# Patient Record
Sex: Male | Born: 1960 | Race: Black or African American | Hispanic: No | Marital: Married | Smoking: Never smoker
Health system: Southern US, Community
[De-identification: ages and names within clinical notes are randomized; demographics above are authoritative.]

## PROBLEM LIST (undated history)

## (undated) ENCOUNTER — Emergency Department (HOSPITAL_COMMUNITY): Discharge status: Absent without leave | Payer: Self-pay

## (undated) DIAGNOSIS — J9819 Other pulmonary collapse: Secondary | ICD-10-CM

## (undated) DIAGNOSIS — A159 Respiratory tuberculosis unspecified: Secondary | ICD-10-CM

---

## 2018-09-10 ENCOUNTER — Other Ambulatory Visit: Payer: Self-pay

## 2018-09-10 ENCOUNTER — Encounter (HOSPITAL_COMMUNITY): Payer: Self-pay

## 2018-09-10 DIAGNOSIS — M545 Low back pain: Secondary | ICD-10-CM | POA: Insufficient documentation

## 2018-09-10 DIAGNOSIS — M79604 Pain in right leg: Secondary | ICD-10-CM | POA: Insufficient documentation

## 2018-09-10 NOTE — ED Triage Notes (Signed)
Pt arrived with complaints of right sided lower back pain after getting hit in the back with a box while at work. Pt ambulatory with some difficulty due to pain. No swelling or redness noted. Pt not complaining of any pain along the spine.

## 2018-09-11 ENCOUNTER — Emergency Department (HOSPITAL_COMMUNITY): Payer: Worker's Compensation

## 2018-09-11 ENCOUNTER — Emergency Department (HOSPITAL_COMMUNITY)
Admission: EM | Admit: 2018-09-11 | Discharge: 2018-09-11 | Disposition: A | Discharge status: Home / Self Care | Payer: Self-pay | Attending: Emergency Medicine | Admitting: Emergency Medicine

## 2018-09-11 DIAGNOSIS — M545 Low back pain, unspecified: Secondary | ICD-10-CM

## 2018-09-11 MED ORDER — KETOROLAC TROMETHAMINE 60 MG/2ML IM SOLN
30.0000 mg | Freq: Once | INTRAMUSCULAR | Status: AC
  Administered 2018-09-11: 30 mg via INTRAMUSCULAR
  Filled 2018-09-11: qty 2

## 2018-09-11 MED ORDER — METHOCARBAMOL 500 MG PO TABS: 500.0000 mg | ORAL_TABLET | Freq: Two times a day (BID) | ORAL | 0 refills | Status: AC | PRN

## 2018-09-11 MED ORDER — NAPROXEN 500 MG PO TABS: 500.0000 mg | ORAL_TABLET | Freq: Two times a day (BID) | ORAL | 0 refills | Status: DC | PRN

## 2018-09-11 MED ORDER — LIDOCAINE 5 % EX PTCH
1.0000 | MEDICATED_PATCH | CUTANEOUS | Status: DC
  Administered 2018-09-11: 1 via TRANSDERMAL
  Filled 2018-09-11: qty 1

## 2018-09-11 NOTE — ED Notes (Addendum)
Mis documentation on this patient.

## 2018-09-11 NOTE — ED Notes (Signed)
Patient ambulated to radiology.

## 2018-09-11 NOTE — Discharge Instructions (Signed)
Your Xray was reassuring and did not show any broken bone/fracture. Alternate ice and heat to areas of injury 3-4 times per day to limit inflammation and spasm.  Avoid strenuous activity and heavy lifting.  We recommend consistent use of naproxen in addition to Robaxin for muscle spasms.  Do not drive or drink alcohol after taking Robaxin as it may make you drowsy and impair your judgment.  We recommend follow-up with a primary care doctor to ensure resolution of symptoms.  Return to the ED for any new or concerning symptoms.

## 2018-09-11 NOTE — ED Provider Notes (Signed)
[place] DEPT Provider Note   CSN: [id number] Arrival date & time: 09/10/18  2205    History   Chief Complaint Chief Complaint  Patient presents with  . Back Pain    HPI Aaron Mendoza is a 58 y.o. male.     58 year old male with no significant past medical history presents to the emergency department for evaluation of right low back pain.  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  He has been having constant pain which will intermittently radiate down his right leg.  Pain is aggravated with movement as well as ambulation.  He has not taken any medications for his symptoms.  No history of prior back issues.  Denies associated numbness, extremity weakness, bowel or bladder incontinence, genital or perianal numbness.  The history is provided by the patient. No language interpreter was used.  Back Pain   History reviewed. No pertinent past medical history.  There are no active problems to display for this patient.   ** The histories are not reviewed yet. Please review them in the "History" navigator section and refresh this [place].      Home Medications    Prior to Admission medications   Medication Sig Start Date End Date Taking? Authorizing Provider  methocarbamol (ROBAXIN) 500 MG tablet Take 1 tablet (500 mg total) by mouth every 12 (twelve) hours as needed for muscle spasms. 09/11/18   Antonietta Breach, PA-C  naproxen (NAPROSYN) 500 MG tablet Take 1 tablet (500 mg total) by mouth every 12 (twelve) hours as needed for mild pain or moderate pain. 09/11/18   Antonietta Breach, PA-C    Family History No family history on file.  Social History Social History   Tobacco Use  . Smoking status: Not on file  Substance Use Topics  . Alcohol use: Not on file  . Drug use: Not on file     Allergies   Patient has no known allergies.   Review of  Systems Review of Systems  Musculoskeletal: Positive for back pain.  Ten systems reviewed and are negative for acute change, except as noted in the HPI.    Physical Exam Updated Vital Signs BP (!) 130/97 (BP Location: Left Arm)   Pulse 60   Temp 97.7 F (36.5 C) (Oral)   Resp 18   Ht 6\' 1"  (1.854 m)   SpO2 100%   Physical Exam Vitals signs and nursing note reviewed.  Constitutional:      General: He is not in acute distress.    Appearance: He is well-developed. He is not diaphoretic.     Comments: Nontoxic appearing and in NAD  HENT:     Head: Normocephalic and atraumatic.  Eyes:     General: No scleral icterus.    Conjunctiva/sclera: Conjunctivae normal.  Neck:     Musculoskeletal: Normal range of motion.  Cardiovascular:     Rate and Rhythm: Normal rate and regular rhythm.     Pulses: Normal pulses.  Pulmonary:     Effort: Pulmonary effort is normal. No respiratory distress.     Comments: Respirations even and unlabored Musculoskeletal: Normal range of motion.        General: Tenderness present.     Thoracic back: Normal.     Lumbar back: He exhibits tenderness and pain. He exhibits no edema and no deformity.  Back:     Comments: Positive straight leg raise on right; negative crossed straight leg raise. Mild spasm to right low back.  Skin:    General: Skin is warm and dry.     Coloration: Skin is not pale.     Findings: No erythema or rash.  Neurological:     General: No focal deficit present.     Mental Status: He is alert and oriented to person, place, and time.     Coordination: Coordination normal.     Comments: Ambulatory in the ED with antalgic gait. Sensation to light touch intact.  Psychiatric:        Behavior: Behavior normal.      ED Treatments / Results  Labs (all labs ordered are listed, but only abnormal results are displayed) Labs Reviewed - No data to display  EKG None  Radiology Dg Lumbar Spine Complete  Result Date: 09/11/2018  CLINICAL DATA:  Back pain EXAM: LUMBAR SPINE - COMPLETE 4+ VIEW COMPARISON:  None. FINDINGS: There is no evidence of lumbar spine fracture. Alignment is normal. Intervertebral disc spaces are maintained. IMPRESSION: Negative. Electronically Signed   By: Deatra RobinsonKevin  Herman M.D.   On: 09/11/2018 02:35    Procedures Procedures (including critical care time)  Medications Ordered in ED Medications  lidocaine (LIDODERM) 5 % 1 patch (1 patch Transdermal Patch Applied 09/11/18 0226)  ketorolac (TORADOL) injection 30 mg (30 mg Intramuscular Given 09/11/18 0228)     Initial Impression / Assessment and Plan / ED Course  I have reviewed the triage vital signs and the nursing notes.  Pertinent labs & imaging results that were available during my care of the patient were reviewed by me and considered in my medical decision making (see chart for details).        Patient with back pain onset after being struck in the back with a box at [employer] tonight.  He is neurovascularly intact.  Patient can walk but states is painful.  No loss of bowel or bladder control.  No concern for cauda equina.  Xray reassuring without evidence of fracture, dislocation, bony deformity.  Treated in the emergency department with IM Toradol and a Lidoderm patch as the patient drove himself to the ED.  Will continue with outpatient naproxen and Robaxin.  Return precautions discussed and provided. Patient discharged in stable condition with no unaddressed concerns.   Final Clinical Impressions(s) / ED Diagnoses   Final diagnoses:  Low back pain radiating to right lower extremity    ED Discharge Orders         Ordered    methocarbamol (ROBAXIN) 500 MG tablet  Every 12 hours PRN     09/11/18 0241    naproxen (NAPROSYN) 500 MG tablet  Every 12 hours PRN     09/11/18 0241           Antony MaduraHumes, Bernhardt Riemenschneider, PA-C 09/11/18 0259    Gilda CreasePollina, Christopher J, MD 09/11/18 [phone]

## 2018-09-12 ENCOUNTER — Telehealth: Payer: Self-pay | Admitting: *Deleted

## 2018-09-12 NOTE — Telephone Encounter (Signed)
Pt called for follow-up appointment after ED visit for a Workman's Comp case.  EDCM advised that pt should call employer HR department as Workman's Comp has certain stipulations as to who the pt may visit.

## 2018-10-09 ENCOUNTER — Ambulatory Visit: Payer: Self-pay | Admitting: Orthopaedic Surgery

## 2018-10-15 ENCOUNTER — Ambulatory Visit: Payer: Self-pay | Admitting: Orthopaedic Surgery

## 2019-03-01 ENCOUNTER — Encounter (HOSPITAL_COMMUNITY): Payer: Self-pay | Admitting: Emergency Medicine

## 2019-03-01 ENCOUNTER — Emergency Department (HOSPITAL_COMMUNITY)
Admission: EM | Admit: 2019-03-01 | Discharge: 2019-03-01 | Disposition: A | Discharge status: Home / Self Care | Attending: Emergency Medicine | Admitting: Emergency Medicine

## 2019-03-01 ENCOUNTER — Other Ambulatory Visit: Payer: Self-pay

## 2019-03-01 ENCOUNTER — Ambulatory Visit (HOSPITAL_COMMUNITY): Admission: RE | Admit: 2019-03-01 | Payer: Self-pay | Source: Ambulatory Visit

## 2019-03-01 DIAGNOSIS — F0781 Postconcussional syndrome: Secondary | ICD-10-CM | POA: Diagnosis not present

## 2019-03-01 DIAGNOSIS — S0990XA Unspecified injury of head, initial encounter: Secondary | ICD-10-CM | POA: Diagnosis present

## 2019-03-01 DIAGNOSIS — Y929 Unspecified place or not applicable: Secondary | ICD-10-CM | POA: Diagnosis not present

## 2019-03-01 DIAGNOSIS — S060X0A Concussion without loss of consciousness, initial encounter: Secondary | ICD-10-CM | POA: Insufficient documentation

## 2019-03-01 DIAGNOSIS — W228XXA Striking against or struck by other objects, initial encounter: Secondary | ICD-10-CM | POA: Insufficient documentation

## 2019-03-01 DIAGNOSIS — Y9389 Activity, other specified: Secondary | ICD-10-CM | POA: Diagnosis not present

## 2019-03-01 DIAGNOSIS — Y990 Civilian activity done for income or pay: Secondary | ICD-10-CM | POA: Diagnosis not present

## 2019-03-01 MED ORDER — IBUPROFEN 800 MG PO TABS
800.0000 mg | ORAL_TABLET | Freq: Once | ORAL | Status: AC
  Administered 2019-03-01: 800 mg via ORAL
  Filled 2019-03-01: qty 1

## 2019-03-01 MED ORDER — NAPROXEN 500 MG PO TABS: 500.0000 mg | ORAL_TABLET | Freq: Two times a day (BID) | ORAL | 0 refills | Status: AC

## 2019-03-01 NOTE — Discharge Instructions (Signed)
1. Medications: Naprosyn or Tylenol for pain 2. Treatment: Rest, ice on head.  Concussion precautions given - keep patient in a quiet, not simulating, dark environment. No TV, computer use, video games until headache is resolved completely. No contact sports until cleared by the pediatrician. 3. Follow Up: With primary care physician on Monday if headache persists; in 1 week if improving.  Return to the emergency department if patient becomes lethargic, begins vomiting, develops double vision, speech difficulty, problems walking or other change in mental status.

## 2019-03-01 NOTE — ED Provider Notes (Signed)
[facility]-EMERGENCY DEPT Provider Note   CSN: [id number] Arrival date & time: 03/01/19  0120     History Chief Complaint  Patient presents with  . Head Injury    Aaron Mendoza is a 58 y.o. male with a hx of no major medical problems presents to the Emergency Department complaining of acute, persistent headache after being hit in the head with a metal rod from the sprinkler around 9pm.  Patient reports he had no loss of consciousness and did not fall.  No numbness, tingling or weakness.  He denies vision changes but reports that he does feel somewhat dizzy and off-balance for short period of time when he stands.  He reports he is able to ambulate without difficulty. Patient has no history of concussion and is not anticoagulated.  No vomiting, confusion, seizures, numbness, weakness or tingling.  No neck pain or stiffness.  No treatments prior to arrival.  Patient reports that movement of his eyes does make his headache somewhat worse.  Nothing seems to make it better.   The history is provided by the patient and medical records. No language interpreter was used.       History reviewed. No pertinent past medical history.  There are no problems to display for this patient.   History reviewed. No pertinent surgical history.     History reviewed. No pertinent family history.  Social History   Tobacco Use  . Smoking status: Never Smoker  . Smokeless tobacco: Never Used  Substance Use Topics  . Alcohol use: Never  . Drug use: Never    Home Medications Prior to Admission medications   Medication Sig Start Date End Date Taking? Authorizing Provider  methocarbamol (ROBAXIN) 500 MG tablet Take 1 tablet (500 mg total) by mouth every 12 (twelve) hours as needed for muscle spasms. 09/11/18   Antony MaduraHumes, Kelly, PA-C  naproxen (NAPROSYN) 500 MG tablet Take 1 tablet (500 mg total) by mouth 2 (two) times daily with a meal. As needed for headaches 03/01/19   Tura Roller,  Dahlia ClientHannah, PA-C    Allergies    Patient has no known allergies.  Review of Systems   Review of Systems  Constitutional: Negative for appetite change, diaphoresis, fatigue, fever and unexpected weight change.  HENT: Negative for mouth sores.   Eyes: Negative for visual disturbance.  Respiratory: Negative for cough, chest tightness, shortness of breath and wheezing.   Cardiovascular: Negative for chest pain.  Gastrointestinal: Negative for abdominal pain, constipation, diarrhea, nausea and vomiting.  Endocrine: Negative for polydipsia, polyphagia and polyuria.  Genitourinary: Negative for dysuria, frequency, hematuria and urgency.  Musculoskeletal: Negative for back pain and neck stiffness.  Skin: Negative for rash.  Allergic/Immunologic: Negative for immunocompromised state.  Neurological: Positive for dizziness and headaches. Negative for syncope and light-headedness.  Hematological: Does not bruise/bleed easily.  Psychiatric/Behavioral: Negative for sleep disturbance. The patient is not nervous/anxious.     Physical Exam Updated Vital Signs BP 129/89 (BP Location: Right Arm)   Pulse 64   Temp 98.6 F (37 C) (Oral)   Resp 18   Ht 6\' 1"  (1.854 m)   Wt 74.8 kg   SpO2 99%   BMI 21.77 kg/m   Physical Exam Vitals and nursing note reviewed.  Constitutional:      General: He is not in acute distress.    Appearance: He is well-developed. He is not diaphoretic.  HENT:     Head: Normocephalic and atraumatic.     Comments: No contusion, ecchymosis or  laceration to the scalp. No Malocclusion No battle signs No [place] eyes No hemotympanum bilaterally Eyes:     General: No scleral icterus.    Conjunctiva/sclera: Conjunctivae normal.     Pupils: Pupils are equal, round, and reactive to light.     Comments: No horizontal, vertical or rotational nystagmus  Neck:     Comments: Full active and passive ROM without pain No midline or paraspinal tenderness No nuchal rigidity or  meningeal signs Cardiovascular:     Rate and Rhythm: Normal rate and regular rhythm.  Pulmonary:     Effort: Pulmonary effort is normal. No respiratory distress.  Abdominal:     General: Bowel sounds are normal.     Palpations: Abdomen is soft.     Tenderness: There is no abdominal tenderness. There is no guarding or rebound.  Musculoskeletal:        General: Normal range of motion.     Cervical back: Normal range of motion and neck supple.  Lymphadenopathy:     Cervical: No cervical adenopathy.  Skin:    General: Skin is warm and dry.     Findings: No rash.  Neurological:     Mental Status: He is alert and oriented to person, place, and time.     Cranial Nerves: No cranial nerve deficit.     Motor: No abnormal muscle tone.     Coordination: Coordination normal.     Comments: Mental Status:  Alert, oriented, thought content appropriate. Speech fluent without evidence of aphasia. Able to follow 2 step commands without difficulty.  Cranial Nerves:  II:  Peripheral visual fields grossly normal, pupils equal, round, reactive to light III,IV, VI: ptosis not present, extra-ocular motions intact bilaterally  V,VII: smile symmetric, facial light touch sensation equal VIII: hearing grossly normal bilaterally  IX,X: midline uvula rise  XI: bilateral shoulder shrug equal and strong XII: midline tongue extension  Motor:  5/5 in upper and lower extremities bilaterally including strong and equal grip strength and dorsiflexion/plantar flexion Sensory: light touch normal in all extremities.  Cerebellar: normal finger-to-nose with bilateral upper extremities Gait: normal gait and balance CV: distal pulses palpable throughout   Psychiatric:        Behavior: Behavior normal.        Thought Content: Thought content normal.        Judgment: Judgment normal.     ED Results / Procedures / Treatments    Procedures Procedures (including critical care time)  Medications Ordered in ED  Medications  ibuprofen (ADVIL) tablet 800 mg (has no administration in time range)    ED Course  I have reviewed the triage vital signs and the nursing notes.  Pertinent labs & imaging results that were available during my care of the patient were reviewed by me and considered in my medical decision making (see chart for details).    MDM Rules/Calculators/A&P                         Patient with head injury which did not cause of loss of consciousness but with persistent headache since the initial trauma.  No evidence of skull fracture on physical exam. Patient is not taking anticoagulants, is less than 65 and has no history of subarachnoid or subdural hemorrhage. Patient denies vomiting, amnesia, vision changes, cognitive or memory dysfunction.  Patient with no focal neurological deficits on physical exam.  Discussed thoroughly symptoms to return to the emergency department including severe headaches, disequilibrium, vomiting,  double vision, extremity weakness, difficulty ambulating, or any other concerning symptoms.  Discussed the likely etiology of patient's symptoms being concussive in nature.  Discussed the risk versus benefit of CT scan at this time I do not believe she warrants one. Patient agrees that CT is not indicated at this time.  Patient will be discharged with information pertaining to diagnosis and advised to use over-the-counter medications like NSAIDs and Tylenol for pain relief. Pt has also advised to not participate in contact sports until they are completely asymptomatic for at least 1 week or they are cleared by their doctor.     Final Clinical Impression(s) / ED Diagnoses Final diagnoses:  Concussion without loss of consciousness, initial encounter  Post concussive syndrome    Rx / DC Orders ED Discharge Orders         Ordered    naproxen (NAPROSYN) 500 MG tablet  2 times daily with meals     03/01/19 0427           Hiliary Osorto, Jarrett Soho, PA-C 03/01/19 0429     Orpah Greek, MD 03/01/19 [phone]

## 2019-03-01 NOTE — ED Triage Notes (Signed)
Patient is complaining of a dull headache that comes from a metal rod that fell out of the ceiling at work at [employer]. Patient has a small knot on left top of head.

## 2019-03-07 ENCOUNTER — Encounter (HOSPITAL_COMMUNITY): Payer: Self-pay | Admitting: Emergency Medicine

## 2019-03-07 ENCOUNTER — Emergency Department (HOSPITAL_COMMUNITY)

## 2019-03-07 ENCOUNTER — Emergency Department (HOSPITAL_COMMUNITY)
Admission: EM | Admit: 2019-03-07 | Discharge: 2019-03-07 | Disposition: A | Discharge status: Home / Self Care | Attending: Emergency Medicine | Admitting: Emergency Medicine

## 2019-03-07 ENCOUNTER — Other Ambulatory Visit: Payer: Self-pay

## 2019-03-07 DIAGNOSIS — R42 Dizziness and giddiness: Secondary | ICD-10-CM | POA: Diagnosis present

## 2019-03-07 DIAGNOSIS — F0781 Postconcussional syndrome: Secondary | ICD-10-CM

## 2019-03-07 LAB — CBC WITH DIFFERENTIAL/PLATELET
Abs Immature Granulocytes: 0.01 10*3/uL (ref 0.00–0.07)
Basophils Absolute: 0 10*3/uL (ref 0.0–0.1)
Basophils Relative: 0 %
Eosinophils Absolute: 0 10*3/uL (ref 0.0–0.5)
Eosinophils Relative: 1 %
HCT: 44.5 % (ref 39.0–52.0)
Hemoglobin: 13.7 g/dL (ref 13.0–17.0)
Immature Granulocytes: 0 %
Lymphocytes Relative: 22 %
Lymphs Abs: 1.4 10*3/uL (ref 0.7–4.0)
MCH: 26.3 pg (ref 26.0–34.0)
MCHC: 30.8 g/dL (ref 30.0–36.0)
MCV: 85.4 fL (ref 80.0–100.0)
Monocytes Absolute: 0.6 10*3/uL (ref 0.1–1.0)
Monocytes Relative: 9 %
Neutro Abs: 4.4 10*3/uL (ref 1.7–7.7)
Neutrophils Relative %: 68 %
Platelets: 197 10*3/uL (ref 150–400)
RBC: 5.21 MIL/uL (ref 4.22–5.81)
RDW: 14.2 % (ref 11.5–15.5)
WBC: 6.5 10*3/uL (ref 4.0–10.5)
nRBC: 0 % (ref 0.0–0.2)

## 2019-03-07 LAB — BASIC METABOLIC PANEL
Anion gap: 9 (ref 5–15)
BUN: 11 mg/dL (ref 6–20)
CO2: 28 mmol/L (ref 22–32)
Calcium: 9.1 mg/dL (ref 8.9–10.3)
Chloride: 101 mmol/L (ref 98–111)
Creatinine, Ser: 0.88 mg/dL (ref 0.61–1.24)
GFR calc Af Amer: >60 mL/min (ref >60)
GFR calc non Af Amer: >60 mL/min (ref >60)
Glucose, Bld: 96 mg/dL (ref 70–99)
Potassium: 4.1 mmol/L (ref 3.5–5.1)
Sodium: 138 mmol/L (ref 135–145)

## 2019-03-07 MED ORDER — ONDANSETRON HCL 4 MG PO TABS: 4.0000 mg | ORAL_TABLET | Freq: Four times a day (QID) | ORAL | 0 refills | Status: AC

## 2019-03-07 MED ORDER — MECLIZINE HCL 25 MG PO TABS: 25.0000 mg | ORAL_TABLET | Freq: Three times a day (TID) | ORAL | 0 refills | Status: AC | PRN

## 2019-03-07 NOTE — Discharge Instructions (Signed)
Your labwork and CT scan were reassuring today Please take medication as prescribed. Please allow brain rest as you are likely experiencing symptoms related to your concussion. While at home sit in a darkened room and avoid bright lights including lights from TV and cell phones.  Follow up with [facility] for further evaluation  Return to the ED for any worsening symptoms including worsening headache, difficulty walking, inability to keep liquids down, worsening vision changes, weakness or numbness on one side of your body, speech difficulties

## 2019-03-07 NOTE — ED Notes (Signed)
Pt ambulatory from triage 

## 2019-03-07 NOTE — ED Provider Notes (Signed)
Aaron Mendo[facility] Provider Note   CSN: [id number] Arrival date & time: 03/07/19  [id number]     History Chief Complaint  Patient presents with  . Dizziness    Aaron Mendoza is a 58 y.o. male with no significant PMHx who presents to the ED complaining of persistent dizziness and vision disturbances s/p head injury that occurred on 12/12.    Patient reports that while at work, on the 12th he was walking when a metal pole attached to the wall came undone from a brace swung and hit him on the left side of the head.  No loss of consciousness.  Patient states immediately afterwards he had double vision in his left eye with some nausea.  He was seen in the ED and ultimately did not receive a CT scan of his head after risk versus benefit was discussed with patient.  He reports that since then he has had intermittent dizziness as well as difficulty with vision. He reports that while at work he will see letters and numbers flip on themselves making it difficult to see. He also reports nausea and emesis; reports he is unable to keep anything down. He does also complain of a headache where he was hit. Pt reports it all became too much for him yesterday while at work and he had to call his family to come pick him up to leave work early. Pt denies fever, chills, abdominal pain, unilateral weakness or numbness, speech difficulties, facial droop.   The history is provided by the patient and medical records.       History reviewed. No pertinent past medical history.  There are no problems to display for this patient.   History reviewed. No pertinent surgical history.     No family history on file.  Social History   Tobacco Use  . Smoking status: Never Smoker  . Smokeless tobacco: Never Used  Substance Use Topics  . Alcohol use: Never  . Drug use: Never    Home Medications Prior to Admission medications   Medication Sig Start Date End Date Taking? Authorizing  Provider  naproxen (NAPROSYN) 500 MG tablet Take 1 tablet (500 mg total) by mouth 2 (two) times daily with a meal. As needed for headaches 03/01/19  Yes Muthersbaugh, Dahlia ClientHannah, PA-C  meclizine (ANTIVERT) 25 MG tablet Take 1 tablet (25 mg total) by mouth 3 (three) times daily as needed for dizziness. 03/07/19   Hyman HopesVenter, Dawon Troop, PA-C  methocarbamol (ROBAXIN) 500 MG tablet Take 1 tablet (500 mg total) by mouth every 12 (twelve) hours as needed for muscle spasms. Patient not taking: Reported on 03/07/2019 09/11/18   Antony MaduraHumes, Kelly, PA-C  ondansetron (ZOFRAN) 4 MG tablet Take 1 tablet (4 mg total) by mouth every 6 (six) hours. 03/07/19   Tanda RockersVenter, Baker Moronta, PA-C    Allergies    Patient has no known allergies.  Review of Systems   Review of Systems  Constitutional: Negative for chills and fever.  HENT: Negative for congestion.   Eyes: Positive for visual disturbance.  Respiratory: Negative for shortness of breath.   Cardiovascular: Negative for chest pain.  Gastrointestinal: Positive for nausea and vomiting. Negative for abdominal pain, constipation and diarrhea.  Genitourinary: Negative for difficulty urinating.  Musculoskeletal: Negative for myalgias.  Skin: Negative for rash.  Neurological: Positive for dizziness and headaches. Negative for speech difficulty, weakness and numbness.    Physical Exam Updated Vital Signs BP (!) 134/99   Pulse 65   Temp 97.7 F (36.5 C) (  Oral)   Resp 17   SpO2 100%   Physical Exam Vitals and nursing note reviewed.  Constitutional:      Appearance: He is not ill-appearing or diaphoretic.  HENT:     Head: Normocephalic and atraumatic.     Comments: No obvious hematoma appreciated to left parietal region. No stepoffs or deformity. + TTP.     Right Ear: Tympanic membrane normal.     Left Ear: Tympanic membrane normal.  Eyes:     Extraocular Movements: Extraocular movements intact.     Conjunctiva/sclera: Conjunctivae normal.     Pupils: Pupils are equal,  round, and reactive to light.  Cardiovascular:     Rate and Rhythm: Normal rate and regular rhythm.     Pulses: Normal pulses.  Pulmonary:     Effort: Pulmonary effort is normal.     Breath sounds: Normal breath sounds. No wheezing, rhonchi or rales.  Abdominal:     Palpations: Abdomen is soft.     Tenderness: There is no abdominal tenderness. There is no guarding or rebound.  Musculoskeletal:     Cervical back: Neck supple.  Skin:    General: Skin is warm and dry.  Neurological:     Mental Status: He is alert.     Comments: CN 3-12 grossly intact. Pt does have some difficulty with eye tracking when the lights are on and his glasses are off. When the lights are dimmed in the room and he wears his glasses he has no issue tracking. EOMi.  A&O x4 GCS 15 Sensation and strength intact. Strength 5/5 to BUE and BLEs.  Gait nonataxic including with tandem walking Pt has some difficulty with finger to nose coordination; he is slow to respond when my finger position changes but ultimately able to focus on my finger and follow along. Normal heal to shin.  Neg romberg, neg pronator drift       ED Results / Procedures / Treatments   Labs (all labs ordered are listed, but only abnormal results are displayed) Labs Reviewed  BASIC METABOLIC PANEL  CBC WITH DIFFERENTIAL/PLATELET    EKG EKG Interpretation  Date/Time:  Friday March 07 2019 10:39:05 EST Ventricular Rate:  58 PR Interval:    QRS Duration: 99 QT Interval:  422 QTC Calculation: 415 R Axis:   17 Text Interpretation: Sinus rhythm Borderline prolonged PR interval Baseline wander Artifact Abnormal ECG Confirmed by Gerhard Munch [phone]) on 03/07/2019 12:35:34 PM   Radiology CT Head Wo Contrast  Result Date: 03/07/2019 CLINICAL DATA:  Dizziness, headache following recent head trauma EXAM: CT HEAD WITHOUT CONTRAST TECHNIQUE: Contiguous axial images were obtained from the base of the skull through the vertex without  intravenous contrast. COMPARISON:  None. FINDINGS: Brain: No evidence of acute infarction, hemorrhage, hydrocephalus, extra-axial collection or mass lesion/mass effect. Cavum septum pellucidum and cavum vergae, an anatomic variant. Vascular: No hyperdense vessel or unexpected calcification. Skull: Normal. Negative for fracture or focal lesion. Sinuses/Orbits: No acute finding. Other: None. IMPRESSION: No acute intracranial abnormality. Electronically Signed   By: Duanne Guess M.D.   On: 03/07/2019 11:41    Procedures Procedures (including critical care time)  Medications Ordered in ED Medications - No data to display  ED Course  I have reviewed the triage vital signs and the nursing notes.  Pertinent labs & imaging results that were available during my care of the patient were reviewed by me and considered in my medical decision making (see chart for details).  68-year-old male who  presents to the ED today with continued complains of headache, dizziness, vision changes, nausea, vomiting after being hit in the head by a metal pole that fell off the wall while at work on 12/12.  Patient was evaluated that day in the ED and ultimately risk versus benefit was discussed with obtaining a CT head and ultimately patient did not receive a CT scan as he did not pass out and his neuro exam was reassuring at that time.  He was told that if his symptoms do not improve or he has any worsening symptoms he should return to the ED.  States that his symptoms have been getting too much for him to handle and he had to leave work early yesterday which prompted his ED visit today.   Was able to ambulate from the waiting room to his room without difficulty.  He does not appear unsteady with gait.  His vitals are stable today.  He is afebrile without tachycardia or tachypnea.  Patient able to follow commands without difficulty.  He does have some issues with tracking in a bright lit room.  Once the room is dimmed he has  no issues.  Suspect he is having difficulty with straining more than anything in regards to this.  He also has some questionable difficulty with finger-to-nose.  It seems as though patient is delayed with finding my finger once the position changes but ultimately is able to refocus on my finger and appropriately find it with his own finger.  Dysmetria.  Otherwise he has no focal neuro deficits on exam today.  He has equal strength bilaterally to upper and lower extremities.   Given he did not have a CT scan initially will obtain this at this time.  Question delayed bleed versus concussive symptoms versus TBI.  Obtain baseline screening labs and EKG as well.   Work reassuring.  CBC without any abnormalities.  No leukocytosis and hemoglobin stable.  BMP without any electrolyte abnormalities.  Potassium 4.1.  Creatinine stable 0.88.   ED had is negative.  Discussed case with attending physician Dr. Vanita Panda who recommends consulting neurology for further recommendations and to see whether or not patient truly needs an MRI done at this time.   Clinical Course as of Mar 06 1410  Fri Mar 07, 2019  1221 Discussed case with Dr. Lorraine Lax with neurology who does not believe pt requires an emergent MRI. Symptoms consistent with post concussive syndrome. Recommends symptomatic management.    [MV]    Clinical Course User Index [MV] Eustaquio Maize, PA-C   Will prescribe meclizine for dizziness.  Zofran for nausea.  Patient without QTC prolongation.  I have advised Tylenol as needed for headache.  Patient advised to follow-up with [facility] for further evaluation if no improvement.  Had lengthy discussion with patient regarding need for brain rest.  It appears he has been attempting to go back to work and not miss a beat which is unlikely given concussive symptoms.  I have written him out for a couple of days so that he can rest.  Strict return precautions have been discussed with patient.  He is in  agreement with plan at this time is stable for discharge home.   This note was prepared using Dragon voice recognition software and may include unintentional dictation errors due to the inherent limitations of voice recognition software.  MDM Rules/Calculators/A&P  Final Clinical Impression(s) / ED Diagnoses Final diagnoses:  Postconcussive syndrome    Rx / DC Orders ED Discharge Orders         Ordered    meclizine (ANTIVERT) 25 MG tablet  3 times daily PRN     03/07/19 1409    ondansetron (ZOFRAN) 4 MG tablet  Every 6 hours     03/07/19 1409           Tanda Rockers, PA-C 03/07/19 1412    Gerhard Munch, MD 03/07/19 1506

## 2019-03-07 NOTE — ED Notes (Signed)
Discharge paperwork and prescriptions reviewed with pt. Pt had no questions or concerns at this time.  Pt ambulatory at discharge.

## 2019-03-07 NOTE — ED Triage Notes (Signed)
Pt reports on 12/12 was hit in head by a metal pole at work and was seen here. Reports still having dizziness and headaches when he is up moving around and having nausea/vomiting. Reports the medications he was given aren't helping. Reports he didn't get a head CT when he was here before.

## 2019-05-19 ENCOUNTER — Other Ambulatory Visit: Payer: Self-pay | Admitting: Specialist

## 2019-05-19 DIAGNOSIS — F0781 Postconcussional syndrome: Secondary | ICD-10-CM

## 2019-05-19 DIAGNOSIS — R519 Headache, unspecified: Secondary | ICD-10-CM

## 2019-05-19 DIAGNOSIS — H9319 Tinnitus, unspecified ear: Secondary | ICD-10-CM

## 2019-06-18 ENCOUNTER — Ambulatory Visit
Admission: RE | Admit: 2019-06-18 | Discharge: 2019-06-18 | Disposition: A | Discharge status: Home / Self Care | Payer: Self-pay | Source: Ambulatory Visit | Attending: Specialist | Admitting: Specialist

## 2019-06-18 ENCOUNTER — Other Ambulatory Visit: Payer: Self-pay

## 2019-06-18 DIAGNOSIS — F0781 Postconcussional syndrome: Secondary | ICD-10-CM

## 2019-06-18 DIAGNOSIS — R519 Headache, unspecified: Secondary | ICD-10-CM

## 2019-06-18 DIAGNOSIS — H9319 Tinnitus, unspecified ear: Secondary | ICD-10-CM

## 2019-10-20 ENCOUNTER — Other Ambulatory Visit: Payer: Self-pay | Admitting: Specialist

## 2019-10-20 DIAGNOSIS — M5412 Radiculopathy, cervical region: Secondary | ICD-10-CM

## 2019-11-06 ENCOUNTER — Ambulatory Visit
Admission: RE | Admit: 2019-11-06 | Discharge: 2019-11-06 | Disposition: A | Discharge status: Home / Self Care | Payer: Worker's Compensation | Source: Ambulatory Visit | Attending: Specialist | Admitting: Specialist

## 2019-11-06 DIAGNOSIS — M5412 Radiculopathy, cervical region: Secondary | ICD-10-CM

## 2020-06-02 ENCOUNTER — Emergency Department (HOSPITAL_COMMUNITY)
Admission: EM | Admit: 2020-06-02 | Discharge: 2020-06-02 | Disposition: A | Discharge status: Home / Self Care | Payer: 59 | Attending: Emergency Medicine | Admitting: Emergency Medicine

## 2020-06-02 ENCOUNTER — Other Ambulatory Visit: Payer: Self-pay

## 2020-06-02 ENCOUNTER — Encounter (HOSPITAL_COMMUNITY): Payer: Self-pay

## 2020-06-02 DIAGNOSIS — B029 Zoster without complications: Secondary | ICD-10-CM

## 2020-06-02 DIAGNOSIS — L729 Follicular cyst of the skin and subcutaneous tissue, unspecified: Secondary | ICD-10-CM

## 2020-06-02 DIAGNOSIS — R21 Rash and other nonspecific skin eruption: Secondary | ICD-10-CM | POA: Diagnosis present

## 2020-06-02 HISTORY — DX: Other pulmonary collapse: J98.19

## 2020-06-02 HISTORY — DX: Respiratory tuberculosis unspecified: A15.9

## 2020-06-02 MED ORDER — PREDNISONE 20 MG PO TABS: 40.0000 mg | ORAL_TABLET | Freq: Every day | ORAL | 0 refills | Status: AC

## 2020-06-02 MED ORDER — HYDROCODONE-ACETAMINOPHEN 5-325 MG PO TABS: 1.0000 | ORAL_TABLET | Freq: Four times a day (QID) | ORAL | 0 refills | Status: AC | PRN

## 2020-06-02 MED ORDER — VALACYCLOVIR HCL 1 G PO TABS: 1000.0000 mg | ORAL_TABLET | Freq: Three times a day (TID) | ORAL | 0 refills | Status: AC

## 2020-06-02 NOTE — ED Triage Notes (Signed)
Pt reports that he has a small rash to his right side and an area to his left side that firm and it is draining. The rash started 5 days ago and the firm area started a month ago.

## 2020-06-02 NOTE — Discharge Instructions (Addendum)
As we discussed today, your rash is consistent with shingles.  We will plan to treat this with Valtrex, pain medication.  Please follow-up with the outpatient neurology office as directed.  Take pain medications as directed for break through pain. Do not drive or operate machinery while taking this medication.   Take prednisone as directed.   Additionally, the area on your left side appears to be a cyst.  As we discussed, this may require further excision by general surgery.  I have given you an referrals for them.  Return to the emergency department for any worsening pain, fevers, difficulty breathing or any other worsening concerning symptoms.

## 2020-06-02 NOTE — ED Provider Notes (Signed)
[facility] EMERGENCY DEPARTMENT Provider Note   CSN: [id number] Arrival date & time: 06/02/20  0551     History Chief Complaint  Patient presents with  . Rash    Aaron Mendoza is a 60 y.o. male who presents for evaluation of a rash that has been ongoing to his right side for last 4 days.  He states that he first noticed it at work and states that he had a burning type pain on his torso.  He states that he thought his pants were rubbing against his skin and so he went home and tried to take a shower.  He reports that this burning pain continued and started spreading.  He states that than a day later, he started noticing a rash.  He states he has had a red rash that has spread from his abdomen to his back.  He states that the area is painful.  He states the rash does not extend to his penis or testicles.  He denies any new medications, exposures.  No history of allergies.  He has not had any fever, chest pain, difficulty breathing.  He also reports that he has had an area on his left side that has been ongoing for several months.  He states that this area will routinely get bigger and then start draining and then seemed to get better and then returned.  He has not sought evaluation for this.  He denies any active drainage.  He denies any abdominal pain, rash on his hands or feet.  The history is provided by the patient.       Past Medical History:  Diagnosis Date  . Collapsed lung   . Tuberculosis     There are no problems to display for this patient.   History reviewed. No pertinent surgical history.     History reviewed. No pertinent family history.  Social History   Tobacco Use  . Smoking status: Never Smoker  . Smokeless tobacco: Never Used  Vaping Use  . Vaping Use: Never used  Substance Use Topics  . Alcohol use: Never  . Drug use: Never    Home Medications Prior to Admission medications   Medication Sig Start Date End Date Taking? Authorizing  Provider  meclizine (ANTIVERT) 25 MG tablet Take 1 tablet (25 mg total) by mouth 3 (three) times daily as needed for dizziness. 03/07/19   Hyman Hopes, Margaux, PA-C  methocarbamol (ROBAXIN) 500 MG tablet Take 1 tablet (500 mg total) by mouth every 12 (twelve) hours as needed for muscle spasms. Patient not taking: Reported on 03/07/2019 09/11/18   Antony Madura, PA-C  naproxen (NAPROSYN) 500 MG tablet Take 1 tablet (500 mg total) by mouth 2 (two) times daily with a meal. As needed for headaches 03/01/19   Muthersbaugh, Dahlia Client, PA-C  ondansetron (ZOFRAN) 4 MG tablet Take 1 tablet (4 mg total) by mouth every 6 (six) hours. 03/07/19   Tanda Rockers, PA-C    Allergies    Patient has no known allergies.  Review of Systems   Review of Systems  Constitutional: Negative for fever.  Respiratory: Negative for shortness of breath.   Cardiovascular: Negative for chest pain.  Gastrointestinal: Negative for abdominal pain, nausea and vomiting.  Skin: Positive for rash.  All other systems reviewed and are negative.   Physical Exam Updated Vital Signs BP 140/87 (BP Location: Right Arm)   Pulse 72   Temp 98.7 F (37.1 C) (Oral)   Resp 18   Ht 6\' 1"  (  1.854 m)   Wt 77.1 kg   SpO2 95%   BMI 22.43 kg/m   Physical Exam Vitals and nursing note reviewed.  Constitutional:      Appearance: Normal appearance. He is well-developed.  HENT:     Head: Normocephalic and atraumatic.     Mouth/Throat:     Comments: No oral lesions Eyes:     General: Lids are normal.     Conjunctiva/sclera: Conjunctivae normal.     Pupils: Pupils are equal, round, and reactive to light.  Cardiovascular:     Rate and Rhythm: Normal rate and regular rhythm.     Pulses: Normal pulses.     Heart sounds: Normal heart sounds. No murmur heard. No friction rub. No gallop.   Pulmonary:     Effort: Pulmonary effort is normal.     Breath sounds: Normal breath sounds.     Comments: Lungs clear to auscultation bilaterally.   Symmetric chest rise.  No wheezing, rales, rhonchi. Abdominal:     Palpations: Abdomen is soft. Abdomen is not rigid.     Tenderness: There is no abdominal tenderness. There is no guarding.     Comments: Abdomen is soft, non-distended, non-tender. No rigidity, No guarding. No peritoneal signs.  Musculoskeletal:        General: Normal range of motion.     Cervical back: Full passive range of motion without pain.  Skin:    General: Skin is warm and dry.     Capillary Refill: Capillary refill takes less than 2 seconds.          Comments: Scattered erythematous vesicular lesions noted to the T12 dermatome on the right side.  This does not extend over the midline.  No active drainage.  No rash noted on palms or soles of feet.  He has a small 2 cm in diameter subcutaneous cyst noted to the left torso.  No fluctuance.  No active drainage.  No surrounding warmth, erythema, edema.  Neurological:     Mental Status: He is alert and oriented to person, place, and time.  Psychiatric:        Speech: Speech normal.     ED Results / Procedures / Treatments   Labs (all labs ordered are listed, but only abnormal results are displayed) Labs Reviewed - No data to display  EKG None  Radiology No results found.  Procedures Procedures   Medications Ordered in ED Medications - No data to display  ED Course  I have reviewed the triage vital signs and the nursing notes.  Pertinent labs & imaging results that were available during my care of the patient were reviewed by me and considered in my medical decision making (see chart for details).    MDM Rules/Calculators/A&P                          60 year old male who presents for evaluation of rash that has been ongoing for the last 4 days.  He states he is for started noticing a burning pain to his side and states that a rash appeared.  No new medicines, exposures.  No chest pain, difficulty breathing, fever.  On initial arrival, he is afebrile,  toxic appearing.  Vital signs are stable.  On exam, he has diffuse erythematous vesicles noted to the T12 dermatome of the right side.  It does not cross over the midline.  This is consistent with shingles.  He also has a small cyst noted  on the left side.  He states is been ongoing for several months.  He states it will drain and then get better and then return again.  At this time, it does not appear infected.  There is no evidence of drainage, fluctuance that would require I&D here in the ED.  Do not see any surrounding warmth, erythema that would be concerning for infectious source.  No negation for antibiotics.  Will give patient outpatient general surgery referral for possible excision.  Additionally, will plan to treat him for herpes zoster with Valtrex, pain medications.  Patient will get neurology follow-up.  Patient with no history of renal dysfunction.  BMP reassuring in 2020. At this time, patient exhibits no emergent life-threatening condition that require further evaluation in ED. Patient had ample opportunity for questions and discussion. All patient's questions were answered with full understanding. Strict return precautions discussed. Patient expresses understanding and agreement to plan.   Portions of this note were generated with Scientist, clinical (histocompatibility and immunogenetics). Dictation errors may occur despite best attempts at proofreading.  Final Clinical Impression(s) / ED Diagnoses Final diagnoses:  Herpes zoster without complication    Rx / DC Orders ED Discharge Orders    None       Rosana Hoes 06/02/20 4235    Blane Ohara, MD 06/03/20 [phone]

## 2020-07-13 ENCOUNTER — Other Ambulatory Visit: Payer: Self-pay | Admitting: Surgery

## 2020-08-29 ENCOUNTER — Encounter (HOSPITAL_COMMUNITY): Payer: Self-pay | Admitting: *Deleted

## 2020-08-29 ENCOUNTER — Emergency Department (HOSPITAL_COMMUNITY)
Admission: EM | Admit: 2020-08-29 | Discharge: 2020-08-30 | Disposition: A | Discharge status: Home / Self Care | Payer: 59 | Attending: Emergency Medicine | Admitting: Emergency Medicine

## 2020-08-29 ENCOUNTER — Other Ambulatory Visit: Payer: Self-pay

## 2020-08-29 DIAGNOSIS — L723 Sebaceous cyst: Secondary | ICD-10-CM | POA: Diagnosis not present

## 2020-08-29 DIAGNOSIS — Z5321 Procedure and treatment not carried out due to patient leaving prior to being seen by health care provider: Secondary | ICD-10-CM | POA: Diagnosis not present

## 2020-08-29 NOTE — ED Triage Notes (Signed)
The pt has a known lt lateral chest cyst that he is having surgery on in the next 2 weeks  today the pain has increased and the area has grown larger

## 2020-08-30 NOTE — ED Notes (Signed)
Pt continued to check wait time and decided to go home

## 2020-09-07 NOTE — Pre-Procedure Instructions (Signed)
Surgical Instructions    Your procedure is scheduled on Monday, June 27th.  Report to [facility] Main Entrance "A" at 7:20 A.M., then check in with the Admitting office.  Call this number if you have problems the morning of surgery:  [phone]   If you have any questions prior to your surgery date call [phone]: Open Monday-Friday 8am-4pm    Remember:  Do not eat after midnight the night before your surgery  You may drink clear liquids until 6:20 a.m. the morning of your surgery.   Clear liquids allowed are: Water, Non-Citrus Juices (without pulp), Carbonated Beverages, Clear Tea, Black Coffee Only, and Gatorade    Take these medicines the morning of surgery with A SIP OF WATER  doxycycline (VIBRAMYCIN)  methocarbamol (ROBAXIN)-as needed oxyCODONE (OXY IR/ROXICODONE)-as needed  As of today, STOP taking any Aspirin (unless otherwise instructed by your surgeon) Aleve, Naproxen, Ibuprofen, Motrin, Advil, Goody's, BC's, all herbal medications, fish oil, and all vitamins.                     Do NOT Smoke (Tobacco/Vaping) or drink Alcohol 24 hours prior to your procedure.  If you use a CPAP at night, you may bring all equipment for your overnight stay.   Contacts, glasses, piercing's, hearing aid's, dentures or partials may not be worn into surgery, please bring cases for these belongings.    For patients admitted to the hospital, discharge time will be determined by your treatment team.   Patients discharged the day of surgery will not be allowed to drive home, and someone needs to stay with them for 24 hours.  ONLY 1 SUPPORT PERSON MAY BE PRESENT WHILE YOU ARE IN SURGERY. IF YOU ARE TO BE ADMITTED ONCE YOU ARE IN YOUR ROOM YOU WILL BE ALLOWED TWO (2) VISITORS.  Minor children may have two parents present. Special consideration for safety and communication needs will be reviewed on a case by case basis.   Special instructions:   [place]- Preparing For Surgery  Before  surgery, you can play an important role. Because skin is not sterile, your skin needs to be as free of germs as possible. You can reduce the number of germs on your skin by washing with CHG (chlorahexidine gluconate) Soap before surgery.  CHG is an antiseptic cleaner which kills germs and bonds with the skin to continue killing germs even after washing.    Oral Hygiene is also important to reduce your risk of infection.  Remember - BRUSH YOUR TEETH THE MORNING OF SURGERY WITH YOUR REGULAR TOOTHPASTE  Please do not use if you have an allergy to CHG or antibacterial soaps. If your skin becomes reddened/irritated stop using the CHG.  Do not shave (including legs and underarms) for at least 48 hours prior to first CHG shower. It is OK to shave your face.  Please follow these instructions carefully.   Shower the NIGHT BEFORE SURGERY and the MORNING OF SURGERY  If you chose to wash your hair, wash your hair first as usual with your normal shampoo.  After you shampoo, rinse your hair and body thoroughly to remove the shampoo.  Use CHG Soap as you would any other liquid soap. You can apply CHG directly to the skin and wash gently with a scrungie or a clean washcloth.   Apply the CHG Soap to your body ONLY FROM THE NECK DOWN.  Do not use on open wounds or open sores. Avoid contact with your eyes, ears,  mouth and genitals (private parts). Wash Face and genitals (private parts)  with your normal soap.   Wash thoroughly, paying special attention to the area where your surgery will be performed.  Thoroughly rinse your body with warm water from the neck down.  DO NOT shower/wash with your normal soap after using and rinsing off the CHG Soap.  Pat yourself dry with a CLEAN TOWEL.  Wear CLEAN PAJAMAS to bed the night before surgery  Place CLEAN SHEETS on your bed the night before your surgery  DO NOT SLEEP WITH PETS.   Day of Surgery: Shower with CHG soap. Do not wear jewelry. Do not wear  lotions, powders, colognes, or deodorant. Men may shave face and neck. Do not bring valuables to the hospital. [facility] is not responsible for any belongings or valuables. Wear Clean/Comfortable clothing the morning of surgery Remember to brush your teeth WITH YOUR REGULAR TOOTHPASTE.   Please read over the following fact sheets that you were given.

## 2020-09-08 ENCOUNTER — Inpatient Hospital Stay (HOSPITAL_COMMUNITY)
Admission: RE | Admit: 2020-09-08 | Discharge: 2020-09-08 | Disposition: A | Discharge status: Home / Self Care | Payer: 59 | Source: Ambulatory Visit

## 2020-09-08 NOTE — Progress Notes (Signed)
Spoke with patient about rescheduling pre op appt since he did not show up today. Will notify Dondra Spry and see if we can get him in Thursday or Friday of this week.

## 2020-09-08 NOTE — Progress Notes (Signed)
Called patient with no answer. Left message stating phone number to call back to reschedule.

## 2020-09-13 ENCOUNTER — Ambulatory Visit (HOSPITAL_COMMUNITY): Admission: RE | Admit: 2020-09-13 | Payer: 59 | Source: Home / Self Care | Admitting: Surgery

## 2020-09-13 ENCOUNTER — Encounter (HOSPITAL_COMMUNITY): Admission: RE | Payer: Self-pay | Source: Home / Self Care

## 2020-09-13 SURGERY — CYST REMOVAL TRUNK
Anesthesia: Choice | Laterality: Left

## 2021-03-31 ENCOUNTER — Emergency Department (HOSPITAL_BASED_OUTPATIENT_CLINIC_OR_DEPARTMENT_OTHER)
Admission: EM | Admit: 2021-03-31 | Discharge: 2021-04-01 | Disposition: A | Discharge status: Home / Self Care | Payer: 59 | Attending: Emergency Medicine | Admitting: Emergency Medicine

## 2021-03-31 ENCOUNTER — Emergency Department (HOSPITAL_BASED_OUTPATIENT_CLINIC_OR_DEPARTMENT_OTHER): Payer: 59 | Admitting: Radiology

## 2021-03-31 ENCOUNTER — Encounter (HOSPITAL_BASED_OUTPATIENT_CLINIC_OR_DEPARTMENT_OTHER): Payer: Self-pay

## 2021-03-31 ENCOUNTER — Emergency Department (HOSPITAL_BASED_OUTPATIENT_CLINIC_OR_DEPARTMENT_OTHER): Payer: 59

## 2021-03-31 ENCOUNTER — Other Ambulatory Visit: Payer: Self-pay

## 2021-03-31 DIAGNOSIS — Z20822 Contact with and (suspected) exposure to covid-19: Secondary | ICD-10-CM | POA: Insufficient documentation

## 2021-03-31 DIAGNOSIS — R079 Chest pain, unspecified: Secondary | ICD-10-CM | POA: Insufficient documentation

## 2021-03-31 DIAGNOSIS — R051 Acute cough: Secondary | ICD-10-CM | POA: Diagnosis not present

## 2021-03-31 LAB — BASIC METABOLIC PANEL
Anion gap: 9 (ref 5–15)
BUN: 14 mg/dL (ref 6–20)
CO2: 25 mmol/L (ref 22–32)
Calcium: 9.2 mg/dL (ref 8.9–10.3)
Chloride: 104 mmol/L (ref 98–111)
Creatinine, Ser: 0.93 mg/dL (ref 0.61–1.24)
GFR, Estimated: >60 mL/min (ref >60)
Glucose, Bld: 99 mg/dL (ref 70–99)
Potassium: 3.8 mmol/L (ref 3.5–5.1)
Sodium: 138 mmol/L (ref 135–145)

## 2021-03-31 LAB — CBC
HCT: 44.6 % (ref 39.0–52.0)
Hemoglobin: 14.1 g/dL (ref 13.0–17.0)
MCH: 26.2 pg (ref 26.0–34.0)
MCHC: 31.6 g/dL (ref 30.0–36.0)
MCV: 82.9 fL (ref 80.0–100.0)
Platelets: 202 10*3/uL (ref 150–400)
RBC: 5.38 MIL/uL (ref 4.22–5.81)
RDW: 14.6 % (ref 11.5–15.5)
WBC: 9.5 10*3/uL (ref 4.0–10.5)
nRBC: 0 % (ref 0.0–0.2)

## 2021-03-31 LAB — TROPONIN I (HIGH SENSITIVITY)
Troponin I (High Sensitivity): <2 ng/L (ref <18)
Troponin I (High Sensitivity): <2 ng/L (ref <18)

## 2021-03-31 MED ORDER — LIDOCAINE VISCOUS HCL 2 % MT SOLN
15.0000 mL | Freq: Once | OROMUCOSAL | Status: AC
Start: 2021-03-31 — End: 2021-03-31
  Administered 2021-03-31: 15 mL via ORAL
  Filled 2021-03-31: qty 15

## 2021-03-31 MED ORDER — ALUM & MAG HYDROXIDE-SIMETH 200-200-20 MG/5ML PO SUSP
30.0000 mL | Freq: Once | ORAL | Status: AC
  Administered 2021-03-31: 30 mL via ORAL
  Filled 2021-03-31: qty 30

## 2021-03-31 MED ORDER — SODIUM CHLORIDE 0.9 % IV BOLUS
1000.0000 mL | Freq: Once | INTRAVENOUS | Status: AC
  Administered 2021-03-31: 1000 mL via INTRAVENOUS

## 2021-03-31 MED ORDER — FAMOTIDINE IN NACL 20-0.9 MG/50ML-% IV SOLN
20.0000 mg | Freq: Once | INTRAVENOUS | Status: AC
Start: 2021-03-31 — End: 2021-03-31
  Administered 2021-03-31: 20 mg via INTRAVENOUS
  Filled 2021-03-31: qty 50

## 2021-03-31 MED ORDER — IOHEXOL 350 MG/ML SOLN
80.0000 mL | Freq: Once | INTRAVENOUS | Status: AC | PRN
  Administered 2021-03-31: 80 mL via INTRAVENOUS

## 2021-03-31 NOTE — ED Triage Notes (Signed)
Patient here POV from Home with CP.  Patient states he awoke this AM at 0800 with Burning-Like CP that has persisted since.  Mild Nausea and 1 Episode of Emesis. No Fevers or Congestion.  NAD Noted during Triage. A&Ox4. GCS 15. Ambulatory.

## 2021-03-31 NOTE — ED Notes (Signed)
Patient transported to CT 

## 2021-03-31 NOTE — ED Provider Notes (Signed)
[facility] Emergency Department Provider Note MRN:  [id number]  Arrival date & time: 04/01/21     Chief Complaint   Chest Pain   History of Present Illness   Aaron Mendoza is a 61 y.o. year-old male with a history of pneumothorax, tuberculosis presenting to the ED with chief complaint of chest pain.  Has been having cough for the past 1 or 2 days.  This morning woke up with severe burning chest pain center of the chest associated with inability to breathe in or out.  He called his friend who is a doctor who advised that he go outside and get some fresh air.  This helped but he continued having burning chest pain all day.  No history of acid reflux, no history of heart problems, does not smoke.  Denies any other significant complaints, no abdominal pain, no numbness or weakness to the arms or legs, no headache.  Persistent cough today as well.  Review of Systems  A thorough review of systems was obtained and all systems are negative except as noted in the HPI and PMH.   Patient's Health History    Past Medical History:  Diagnosis Date   Collapsed lung    Tuberculosis     History reviewed. No pertinent surgical history.  No family history on file.  Social History   Socioeconomic History   Marital status: Married    Spouse name: Not on file   Number of children: Not on file   Years of education: Not on file   Highest education level: Not on file  Occupational History   Not on file  Tobacco Use   Smoking status: Never   Smokeless tobacco: Never  Vaping Use   Vaping Use: Never used  Substance and Sexual Activity   Alcohol use: Never   Drug use: Never   Sexual activity: Not on file  Other Topics Concern   Not on file  Social History Narrative   Not on file   Social Determinants of Health   Financial Resource Strain: Not on file  Food Insecurity: Not on file  Transportation Needs: Not on file  Physical Activity: Not on file  Stress: Not on  file  Social Connections: Not on file  Intimate Partner Violence: Not on file     Physical Exam   Vitals:   03/31/21 2314 03/31/21 2315  BP: 136/89 136/89  Pulse: 93 87  Resp: 17 19  Temp:    SpO2: 98% 97%    CONSTITUTIONAL: Well-appearing, NAD NEURO/PSYCH:  Alert and oriented x 3, no focal deficits EYES:  eyes equal and reactive ENT/NECK:  no LAD, no JVD CARDIO: Regular rate, well-perfused, normal S1 and S2 PULM:  CTAB no wheezing or rhonchi GI/GU:  non-distended, non-tender MSK/SPINE:  No gross deformities, no edema SKIN:  no rash, atraumatic   *Additional and/or pertinent findings included in MDM below  Diagnostic and Interventional Summary    EKG Interpretation  Date/Time:  Thursday March 31 2021 19:04:38 EST Ventricular Rate:  82 PR Interval:  184 QRS Duration: 96 QT Interval:  340 QTC Calculation: 397 R Axis:   -34 Text Interpretation: Normal sinus rhythm with sinus arrhythmia Right atrial enlargement Left axis deviation Pulmonary disease pattern Abnormal ECG When compared with ECG of 07-Mar-2019 10:39, PREVIOUS ECG IS PRESENT Confirmed by Kennis Carina [phone]) on 03/31/2021 11:07:18 PM       Labs Reviewed  RESP PANEL BY RT-PCR (FLU A&B, COVID) [id number]  BASIC METABOLIC PANEL  CBC  TROPONIN I (HIGH SENSITIVITY)  TROPONIN I (HIGH SENSITIVITY)    CT Angio Chest Pulmonary Embolism (PE) W or WO Contrast  Final Result    DG Chest 2 View  Final Result      Medications  sodium chloride 0.9 % bolus 1,000 mL (0 mLs Intravenous Stopped 03/31/21 2312)  alum & mag hydroxide-simeth (MAALOX/MYLANTA) 200-200-20 MG/5ML suspension 30 mL (30 mLs Oral Given 03/31/21 2213)    And  lidocaine (XYLOCAINE) 2 % viscous mouth solution 15 mL (15 mLs Oral Given 03/31/21 2213)  famotidine (PEPCID) IVPB 20 mg premix (0 mg Intravenous Stopped 03/31/21 2313)  iohexol (OMNIPAQUE) 350 MG/ML injection 80 mL (80 mLs Intravenous Contrast Given 03/31/21 2326)  dexamethasone (DECADRON)  injection 10 mg (10 mg Intravenous Given 04/01/21 0122)  albuterol (VENTOLIN HFA) 108 (90 Base) MCG/ACT inhaler 2 puff (2 puffs Inhalation Given 04/01/21 0122)     Procedures  /  Critical Care Procedures  ED Course and Medical Decision Making  Initial Impression and Ddx Differential diagnosis includes GERD, pneumothorax, viral illness, bronchitis, pleurisy, PE, dissection, ACS.  Patient describes the painful waking event very clearly and the severity of pain is of particular concern.  Awaiting labs, chest x-ray  Past medical/surgical history that increases complexity of ED encounter: History of collapsed lung, tuberculosis  Interpretation of Diagnostics I personally reviewed the EKG and Chest Xray and my interpretation is as follows: EKG with some nonspecific findings that could suggest pulmonary strain pattern, chest x-ray with likely right pleural effusion.  Given these findings, will obtain CTA to further delineate/exclude emergent processes.    CTA is without acute process.  Patient Reassessment and Ultimate Disposition/Management Patient is looking and feeling better, unclear if symptoms due to GERD or inflammation due to cough/viral process.  Providing with a dose of Decadron, inhaler to be used as needed, will also provide short course of Pepcid and he will follow-up with his primary care doctor.  Patient management required discussion with the following services or consulting groups:  None  Complexity of Problems Addressed Acute complicated illness or Injury  Additional Data Reviewed and Analyzed Further history obtained from: Past medical history and medications listed in the EMR  Patient Encounter Risk Assessment Moderate:  Prescriptions  Elmer Sow. Pilar Plate, MD [facility] Emergency Medicine [facility] [email or web address]  Final Clinical Impressions(s) / ED Diagnoses     ICD-10-CM   1. Chest pain, unspecified type  R07.9     2. Acute cough  R05.1        ED Discharge Orders          Ordered    famotidine (PEPCID) 20 MG tablet  2 times daily        04/01/21 0121             Discharge Instructions Discussed with and Provided to Patient:     Discharge Instructions      You were evaluated in the Emergency Department and after careful evaluation, we did not find any emergent condition requiring admission or further testing in the hospital.  Your exam/testing today was overall reassuring.  Suspect your symptoms are related to inflammation of the lungs or acid reflux.  Can use the albuterol inhaler as needed for coughing fits.  Recommend taking the Pepcid as directed to treat for possible acid reflux.  Recommend follow-up with your regular doctor.  Please return to the Emergency Department if you experience any worsening of your condition.  Thank you for allowing [place]  to be a part of your care.        Sabas SousBero, Syrita Dovel M, MD 04/01/21 [phone]

## 2021-04-01 LAB — RESP PANEL BY RT-PCR (FLU A&B, COVID) ARPGX2
Influenza A by PCR: NEGATIVE
Influenza B by PCR: NEGATIVE
SARS Coronavirus 2 by RT PCR: NEGATIVE

## 2021-04-01 MED ORDER — ALBUTEROL SULFATE HFA 108 (90 BASE) MCG/ACT IN AERS
2.0000 | INHALATION_SPRAY | Freq: Once | RESPIRATORY_TRACT | Status: AC
  Administered 2021-04-01: 2 via RESPIRATORY_TRACT
  Filled 2021-04-01: qty 6.7

## 2021-04-01 MED ORDER — DEXAMETHASONE SODIUM PHOSPHATE 10 MG/ML IJ SOLN
10.0000 mg | Freq: Once | INTRAMUSCULAR | Status: AC
  Administered 2021-04-01: 10 mg via INTRAVENOUS
  Filled 2021-04-01: qty 1

## 2021-04-01 MED ORDER — FAMOTIDINE 20 MG PO TABS: 20.0000 mg | ORAL_TABLET | Freq: Two times a day (BID) | ORAL | 0 refills | Status: AC

## 2021-04-01 NOTE — Discharge Instructions (Signed)
You were evaluated in the Emergency Department and after careful evaluation, we did not find any emergent condition requiring admission or further testing in the hospital.  Your exam/testing today was overall reassuring.  Suspect your symptoms are related to inflammation of the lungs or acid reflux.  Can use the albuterol inhaler as needed for coughing fits.  Recommend taking the Pepcid as directed to treat for possible acid reflux.  Recommend follow-up with your regular doctor.  Please return to the Emergency Department if you experience any worsening of your condition.  Thank you for allowing [place] to be a part of your care.

## 2023-07-20 IMAGING — CT CT ANGIO CHEST
2 of 7 series · 17 of 46 positions shown · IV contrast (agent unspecified)
Comparison: Chest x-ray 03/31/2021

CLINICAL DATA: Burning chest pain emesis

EXAM:
CT ANGIOGRAPHY CHEST WITH CONTRAST
TECHNIQUE: Multidetector CT imaging of the chest was performed using the
standard protocol during bolus administration of intravenous
contrast. Multiplanar CT image reconstructions and MIPs were
obtained to evaluate the vascular anatomy.

[Series 5: pe axial thins · axial · 0.72mm/px · z∈[+623,+892]mm · 14 of 311 slices shown]
[im 21/311  lung]
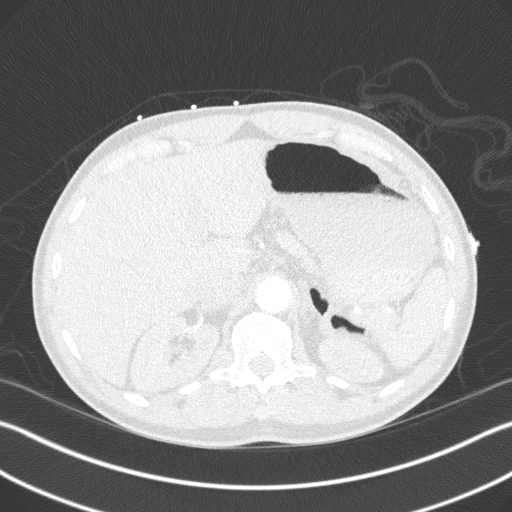
[im 42/311  soft-tissue]
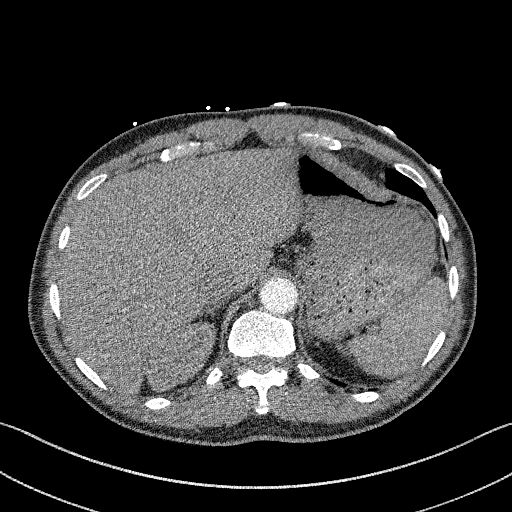
[im 63/311  lung]
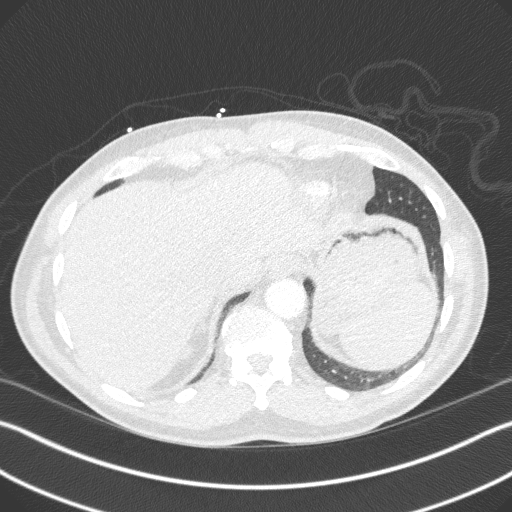
[im 83/311  soft-tissue]
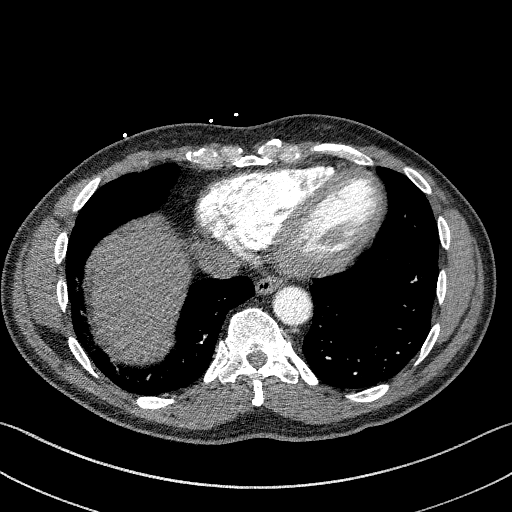
[im 104/311  lung]
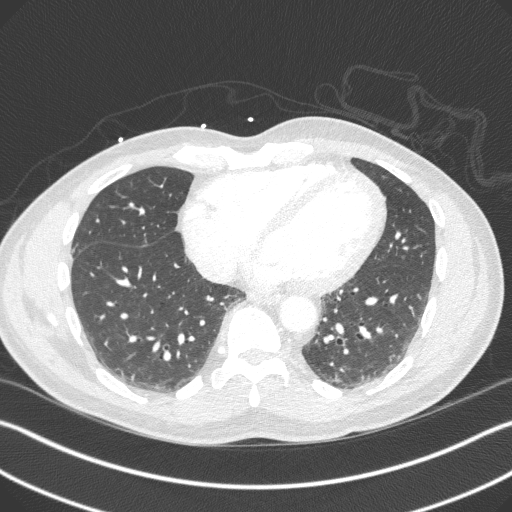
[im 125/311  soft-tissue]
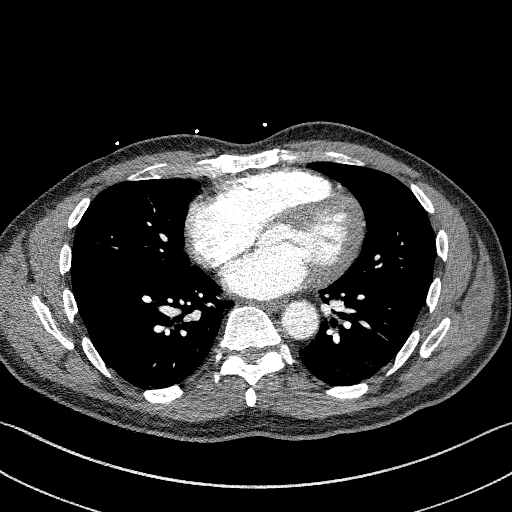
[im 145/311  lung]
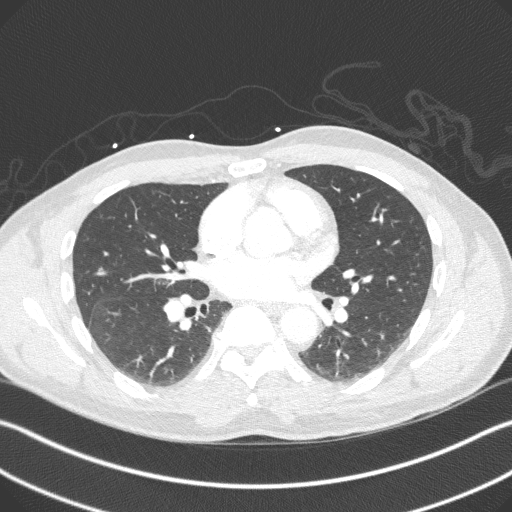
[im 166/311  soft-tissue]
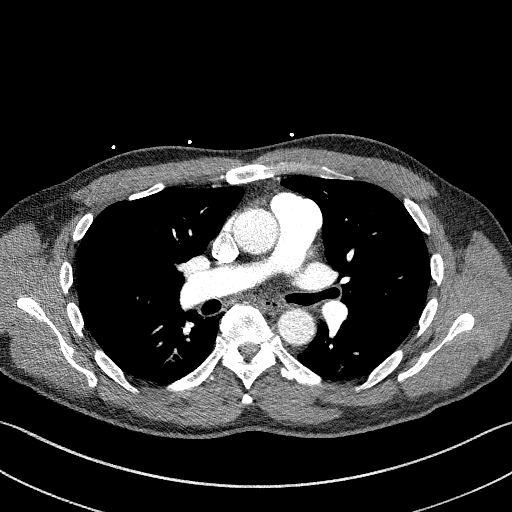
[im 187/311  lung]
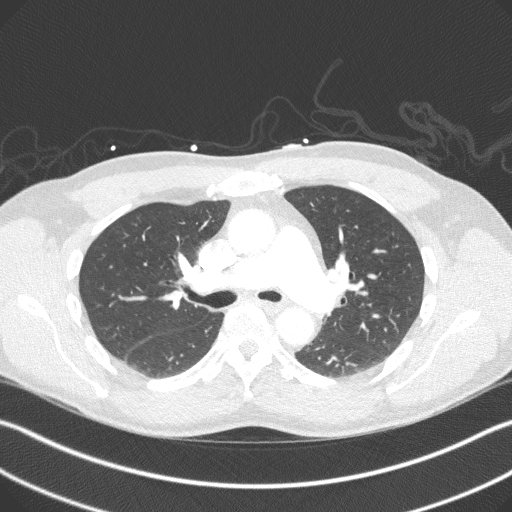
[im 207/311  soft-tissue]
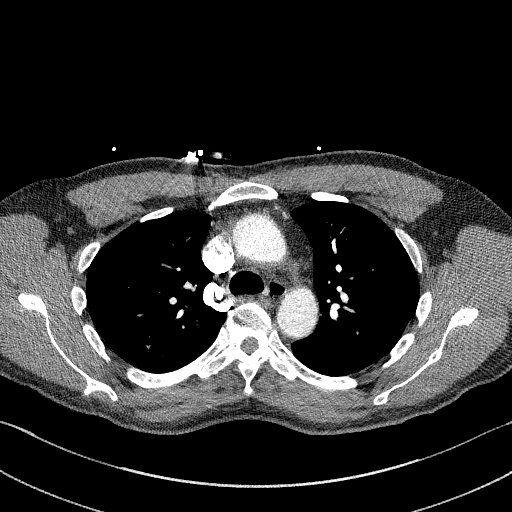
[im 228/311  lung]
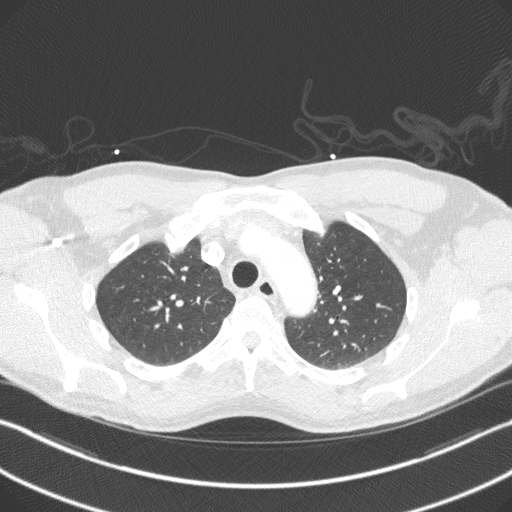
[im 249/311  soft-tissue]
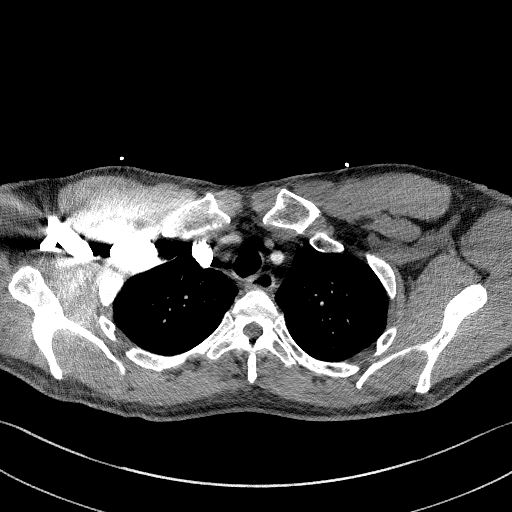
[im 269/311  lung]
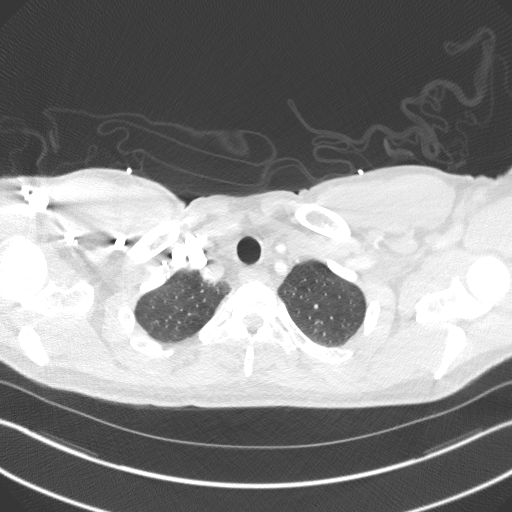
[im 290/311  soft-tissue]
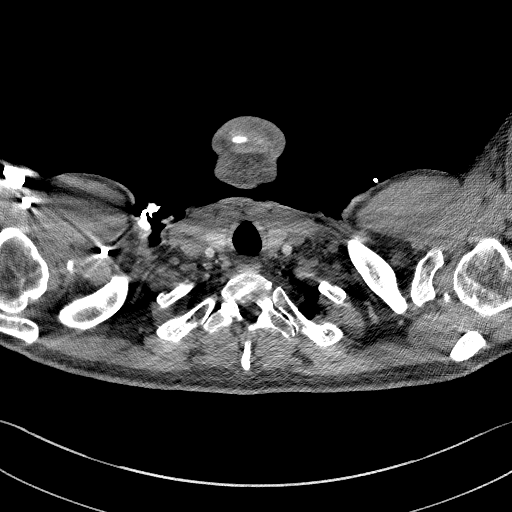

[Series 7: cor soft · coronal · 0.65mm/px · 3 of 128 slices shown]
[im 32/128  soft-tissue]
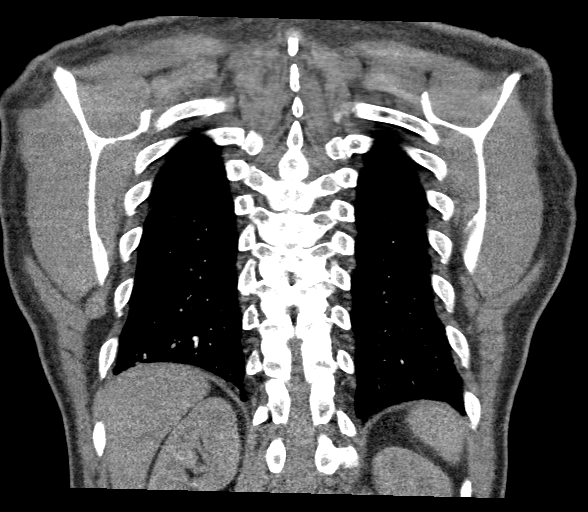
[im 64/128  soft-tissue]
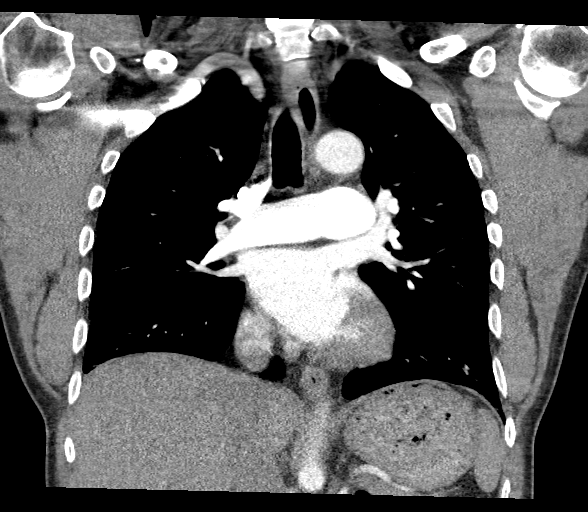
[im 96/128  soft-tissue]
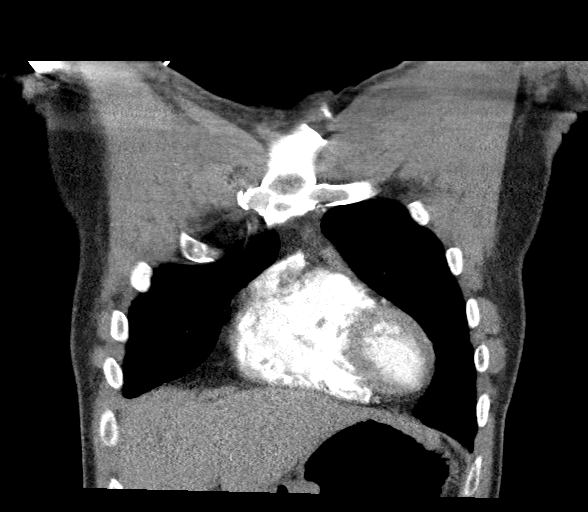

[17 of 46 positions shown; findings below may reference images not displayed]

RADIATION DOSE REDUCTION: This exam was performed according to the
departmental dose-optimization program which includes automated
exposure control, adjustment of the mA and/or kV according to
patient size and/or use of iterative reconstruction technique.

CONTRAST:  80mL OMNIPAQUE IOHEXOL 350 MG/ML SOLN
FINDINGS: Cardiovascular: Satisfactory opacification of the pulmonary arteries
to the segmental level. No evidence of pulmonary embolism. Normal
heart size. No pericardial effusion. Aorta is nonaneurysmal. No
dissection is seen.

Mediastinum/Nodes: Midline trachea. No thyroid mass. Esophagus
within normal limits. No suspicious mediastinal lymph nodes.

Lungs/Pleura: Lungs are clear. No pleural effusion or pneumothorax.

Upper Abdomen: No acute abnormality.

Musculoskeletal: No chest wall abnormality. No acute or significant
osseous findings.

Review of the MIP images confirms the above findings.
IMPRESSION: 1. Negative for acute pulmonary embolus or aortic dissection.
2. Clear lung fields
